# Patient Record
Sex: Male | Born: 1990 | Race: White | Hispanic: No | Marital: Married | Smoking: Never smoker
Health system: Southern US, Community
[De-identification: ages and names within clinical notes are randomized; demographics above are authoritative.]

## PROBLEM LIST (undated history)

## (undated) DIAGNOSIS — N200 Calculus of kidney: Secondary | ICD-10-CM

## (undated) HISTORY — PX: ADENOIDECTOMY: SUR15

## (undated) HISTORY — PX: TONSILLECTOMY: SUR1361

## (undated) HISTORY — PX: ABDOMINAL SURGERY: SHX537

## (undated) HISTORY — PX: OTHER SURGICAL HISTORY: SHX169

## (undated) HISTORY — DX: Calculus of kidney: N20.0

---
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
## 2013-02-08 ENCOUNTER — Encounter (HOSPITAL_COMMUNITY): Payer: Self-pay

## 2013-02-08 ENCOUNTER — Emergency Department (HOSPITAL_COMMUNITY)
Admission: EM | Admit: 2013-02-08 | Discharge: 2013-02-08 | Disposition: A | Discharge status: Home / Self Care | Payer: BC Managed Care – PPO | Attending: Emergency Medicine | Admitting: Emergency Medicine

## 2013-02-08 DIAGNOSIS — R109 Unspecified abdominal pain: Secondary | ICD-10-CM | POA: Insufficient documentation

## 2013-02-08 DIAGNOSIS — T161XXA Foreign body in right ear, initial encounter: Secondary | ICD-10-CM

## 2013-02-08 DIAGNOSIS — Y939 Activity, unspecified: Secondary | ICD-10-CM | POA: Insufficient documentation

## 2013-02-08 DIAGNOSIS — T169XXA Foreign body in ear, unspecified ear, initial encounter: Secondary | ICD-10-CM | POA: Insufficient documentation

## 2013-02-08 DIAGNOSIS — Y929 Unspecified place or not applicable: Secondary | ICD-10-CM | POA: Insufficient documentation

## 2013-02-08 DIAGNOSIS — T162XXA Foreign body in left ear, initial encounter: Secondary | ICD-10-CM

## 2013-02-08 DIAGNOSIS — IMO0002 Reserved for concepts with insufficient information to code with codable children: Secondary | ICD-10-CM | POA: Insufficient documentation

## 2013-02-08 MED ORDER — NEOMYCIN-POLYMYXIN-HC 3.5-10000-1 OT SOLN
3.0000 [drp] | Freq: Once | OTIC | Status: AC
  Administered 2013-02-08: 3 [drp] via OTIC
  Filled 2013-02-08: qty 10

## 2013-02-08 NOTE — ED Provider Notes (Signed)
Medical screening examination/treatment/procedure(s) were performed by non-physician practitioner and as supervising physician I was immediately available for consultation/collaboration.  Donnetta Hutching, MD 02/08/13 [phone]

## 2013-02-08 NOTE — ED Notes (Signed)
q-tip stuck in left ear for 1 day.

## 2013-02-08 NOTE — ED Provider Notes (Signed)
History     CSN: [id number]  Arrival date & time 02/08/13  1510   First MD Initiated Contact with Patient 02/08/13 1520      Chief Complaint  Patient presents with  . Foreign Body in Ear    (Consider location/radiation/quality/duration/timing/severity/associated sxs/prior treatment) Patient is a 22 y.o. male presenting with foreign body in ear. The history is provided by the patient.  Foreign Body in Ear This is a new problem. The current episode started yesterday. The problem occurs constantly. The problem has been unchanged. Associated symptoms include abdominal pain. Pertinent negatives include no arthralgias, chest pain, coughing, fever, neck pain, sore throat or swollen glands. Nothing aggravates the symptoms. He has tried nothing for the symptoms. The treatment provided no relief.    History reviewed. No pertinent past medical history.  Past Surgical History  Procedure Laterality Date  . Abdominal surgery    . Hernia repair    . Tonsillectomy    . Adenoidectomy      No family history on file.  History  Substance Use Topics  . Smoking status: Never Smoker   . Smokeless tobacco: Not on file  . Alcohol Use: No      Review of Systems  Constitutional: Negative for fever and activity change.       All ROS Neg except as noted in HPI  HENT: Negative for nosebleeds, sore throat and neck pain.   Eyes: Negative for photophobia and discharge.  Respiratory: Negative for cough, shortness of breath and wheezing.   Cardiovascular: Negative for chest pain and palpitations.  Gastrointestinal: Positive for abdominal pain. Negative for blood in stool.  Genitourinary: Negative for dysuria, frequency and hematuria.  Musculoskeletal: Negative for back pain and arthralgias.  Skin: Negative.   Neurological: Negative for dizziness, seizures and speech difficulty.  Psychiatric/Behavioral: Negative for hallucinations and confusion.    Allergies  Review of patient's allergies  indicates no known allergies.  Home Medications  No current outpatient prescriptions on file.  BP 139/69  Pulse 90  Temp(Src) 97.9 F (36.6 C) (Oral)  Resp 20  Ht 6' (1.829 m)  Wt 220 lb (99.791 kg)  BMI 29.83 kg/m2  SpO2 100%  Physical Exam  Nursing note and vitals reviewed. Constitutional: He is oriented to person, place, and time. He appears well-developed and well-nourished.  Non-toxic appearance.  HENT:  Head: Normocephalic.  Right Ear: Tympanic membrane and external ear normal.  Left Ear: Tympanic membrane and external ear normal.  Small fb in the right EAC. TM wnl.  Left external auditory canal is obstructed with a foreign body. There's no pain with manipulation of the ear. There's no redness or swelling behind the ear. There is no drainage from the ear.  Eyes: EOM and lids are normal. Pupils are equal, round, and reactive to light.  Neck: Normal range of motion. Neck supple. Carotid bruit is not present.  Cardiovascular: Normal rate, regular rhythm, normal heart sounds, intact distal pulses and normal pulses.   Pulmonary/Chest: Breath sounds normal. No respiratory distress.  Abdominal: Soft. Bowel sounds are normal. There is no tenderness. There is no guarding.  Musculoskeletal: Normal range of motion.  Lymphadenopathy:       Head (right side): No submandibular adenopathy present.       Head (left side): No submandibular adenopathy present.    He has no cervical adenopathy.  Neurological: He is alert and oriented to person, place, and time. He has normal strength. No cranial nerve deficit or sensory deficit.  Skin:  Skin is warm and dry.  Psychiatric: He has a normal mood and affect. His speech is normal.    ED Course  Procedures:  REMOVAL OF FB LEFT EAR.  Patient identified by arm band. Permission for the procedure is given by the patient. Procedural time out taken before removal of foreign body from the left year.  Procedure explained to the patient in terms which  he understands, and he is in agreement with the procedure.  Using an otoscope, the foreign body was located in the external auditory canal of the left ear. Using sterile alligator forceps, the foreign body was mobilized, and then removed. After the removal of foreign body the external auditory canal was evaluated. There is no bleeding present. The left tympanic membrane is still difficult to see due to to a small wax impaction just over the tympanic membrane. Patient states he can hear much better. Patient tolerated the procedure without problem.  Labs Reviewed - No data to display No results found.   No diagnosis found.    MDM  I have reviewed nursing notes, vital signs, and all appropriate lab and imaging results for this patient. The patient admits to using Q-tips on a regular basis. One to 2 days ago he got the entire head of a Q-tip stuck in the left year, and a portion of Q-tip stuck in the right ear (date unknown). The right ear was irrigated, the foreign body of the left year was removed without any forceps without complication.  The patient has been admonished not to use Q-tips. Patient is given Cortisporin otic suspension for both years in the evening any injury to the auditory canal that was not visualized on this examination. Patient also advised to use a ear wax removal kit from his local drugstore if having problems moving wax with regular soap and water and washcloth.       Kathie Dike, PA-C 02/08/13 [phone]

## 2013-02-08 NOTE — ED Notes (Signed)
Patient has a small amount of cotton and wax noted in right ear as well. Patient's ear irrigated with good results.

## 2014-05-26 ENCOUNTER — Emergency Department (HOSPITAL_COMMUNITY): Payer: PRIVATE HEALTH INSURANCE

## 2014-05-26 ENCOUNTER — Encounter (HOSPITAL_COMMUNITY): Payer: Self-pay | Admitting: Emergency Medicine

## 2014-05-26 ENCOUNTER — Emergency Department (HOSPITAL_COMMUNITY)
Admission: EM | Admit: 2014-05-26 | Discharge: 2014-05-27 | Disposition: A | Discharge status: Home / Self Care | Payer: PRIVATE HEALTH INSURANCE | Attending: Emergency Medicine | Admitting: Emergency Medicine

## 2014-05-26 DIAGNOSIS — N200 Calculus of kidney: Secondary | ICD-10-CM | POA: Insufficient documentation

## 2014-05-26 LAB — URINALYSIS, ROUTINE W REFLEX MICROSCOPIC
Glucose, UA: 250 mg/dL — AB
NITRITE: POSITIVE — AB
PH: 5.5 (ref 5.0–8.0)
PROTEIN: 100 mg/dL — AB
Specific Gravity, Urine: 1.02 (ref 1.005–1.030)
Urobilinogen, UA: >8 mg/dL — ABNORMAL HIGH (ref 0.0–1.0)

## 2014-05-26 LAB — URINE MICROSCOPIC-ADD ON

## 2014-05-26 MED ORDER — ONDANSETRON HCL 4 MG/2ML IJ SOLN: 4.0000 mg | Freq: Once | INTRAMUSCULAR | Status: DC

## 2014-05-26 MED ORDER — HYDROMORPHONE HCL PF 1 MG/ML IJ SOLN
1.0000 mg | Freq: Once | INTRAMUSCULAR | Status: AC
  Administered 2014-05-26: 1 mg via INTRAVENOUS
  Filled 2014-05-26: qty 1

## 2014-05-26 MED ORDER — ONDANSETRON HCL 4 MG/2ML IJ SOLN
4.0000 mg | Freq: Once | INTRAMUSCULAR | Status: AC
  Administered 2014-05-26: 4 mg via INTRAVENOUS
  Filled 2014-05-26: qty 2

## 2014-05-26 NOTE — ED Notes (Signed)
Right flank pain x 1 week, with painful burning urination.  progressively gotten worse. Taken tylenol, ibuprofen and alieve with no relief.

## 2014-05-26 NOTE — ED Provider Notes (Signed)
CSN: [id number]     Arrival date & time 05/26/14  2202 History   First MD Initiated Contact with Patient 05/26/14 2302     Chief Complaint  Patient presents with  . Flank Pain     (Consider location/radiation/quality/duration/timing/severity/associated sxs/prior Treatment) Patient is a 23 y.o. male presenting with flank pain. The history is provided by the patient.  Flank Pain This is a new problem. The current episode started today. The problem occurs constantly. The problem has been unchanged. Associated symptoms include urinary symptoms. Pertinent negatives include no abdominal pain, diaphoresis, fever, joint swelling, nausea, numbness, vomiting or weakness. Exacerbated by: urination. He has tried drinking for the symptoms. The treatment provided no relief.    Patient reports brief episode of right flank pain that began 6 days ago that spontaneously resolved.  Tonight, he states the pain returned and has been severe for 1-2 hours.  He also reports burning with urination and hesitancy.  Pain to the right flank that radiates to the right lower abdomen.  He has tried drinking water and cranberry juice w/o relief.  He denies vomiting, fever, chills, penile d/c or testicle pain.  Patient denies hx of kidney stones, but states his mother has kidney stones .  Pt has been taking AZO standard w/o relief.    History reviewed. No pertinent past medical history. Past Surgical History  Procedure Laterality Date  . Abdominal surgery    . Hernia repair    . Tonsillectomy    . Adenoidectomy     History reviewed. No pertinent family history. History  Substance Use Topics  . Smoking status: Never Smoker   . Smokeless tobacco: Not on file  . Alcohol Use: No    Review of Systems  Constitutional: Negative for fever, diaphoresis, activity change and appetite change.  Gastrointestinal: Negative for nausea, vomiting, abdominal pain and abdominal distention.  Genitourinary: Positive for dysuria, flank  pain and difficulty urinating. Negative for urgency, hematuria, discharge, penile swelling, scrotal swelling and testicular pain.  Musculoskeletal: Negative for joint swelling.  Neurological: Negative for weakness and numbness.  All other systems reviewed and are negative.     Allergies  Review of patient's allergies indicates no known allergies.  Home Medications   Prior to Admission medications   Not on File   BP 146/83  Pulse 82  Temp(Src) 98.4 F (36.9 C) (Oral)  Resp 18  Ht 6\' 3"  (1.905 m)  Wt 220 lb (99.791 kg)  BMI 27.50 kg/m2  SpO2 99% Physical Exam  Nursing note and vitals reviewed. Constitutional: He is oriented to person, place, and time. He appears well-developed and well-nourished.  Patient appears uncomfortable, pacing in the exam room  HENT:  Head: Normocephalic and atraumatic.  Neck: Normal range of motion. Neck supple.  Cardiovascular: Normal rate, regular rhythm, normal heart sounds and intact distal pulses.   No murmur heard. Pulmonary/Chest: Effort normal and breath sounds normal. No respiratory distress.  Abdominal: Soft. Normal appearance. He exhibits no distension. There is no tenderness. There is no rebound, no guarding and no CVA tenderness.  Pt describes pain to right flank that radiates to the RLQ.  No CVA tenderness on exam.  Abdomen is soft, NT on exam.    Musculoskeletal: Normal range of motion.  Neurological: He is alert and oriented to person, place, and time. He exhibits normal muscle tone. Coordination normal.  Skin: Skin is warm and dry. No rash noted.    ED Course  Procedures (including critical care time) Labs Review  Labs Reviewed  URINALYSIS, ROUTINE W REFLEX MICROSCOPIC - Abnormal; Notable for the following:    Color, Urine ORANGE (*)    Glucose, UA 250 (*)    Hgb urine dipstick LARGE (*)    Bilirubin Urine SMALL (*)    Ketones, ur TRACE (*)    Protein, ur 100 (*)    Urobilinogen, UA >8.0 (*)    Nitrite POSITIVE (*)     Leukocytes, UA TRACE (*)    All other components within normal limits  URINE MICROSCOPIC-ADD ON - Abnormal; Notable for the following:    Bacteria, UA FEW (*)    Crystals CA OXALATE CRYSTALS (*)    All other components within normal limits  URINE CULTURE    Imaging Review Ct Abdomen Pelvis Wo Contrast  05/27/2014   CLINICAL DATA:  Flank pain and dysuria  EXAM: CT ABDOMEN AND PELVIS WITHOUT CONTRAST  TECHNIQUE: Multidetector CT imaging of the abdomen and pelvis was performed following the standard protocol without oral or intravenous contrast material administration.  COMPARISON:  January 17, 2008  FINDINGS: Lung bases are clear.  No focal liver lesions are identified on this noncontrast enhanced study. There is no biliary duct dilatation. Gallbladder wall is not appreciably thickened.  Spleen, pancreas, adrenals appear normal.  There is a 1.2 x 1.0 cm cyst in the mid left kidney laterally. No other renal masses are identified. There is no hydronephrosis on the left. 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 There is no intrarenal calculus on either side.  There is a 3 mm calculus at the right ureterovesical junction. No other ureteral calculus identified on either side.  In the pelvis, the urinary bladder is midline without wall thickness. There is a prostatic calculus. There is no pelvic mass or fluid collection. The appendix appears normal.  There is no bowel obstruction. There is no free air or portal venous air. There is a small ventral hernia which contains only fat.  There is no ascites, adenopathy, or abscess in the abdomen or pelvis. The aorta is nonaneurysmal. There are no blastic or lytic bone lesions.  IMPRESSION: 3 mm calculus right ureterovesical junction with moderate hydronephrosis on the right. 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  Both moieties show moderate hydronephrosis.  No bowel obstruction. No abscess. Appendix appears normal. Note that there is a prostatic calculus present. There is a small ventral hernia containing only fat.   Electronically Signed   By: Bretta Bang M.D.   On: 05/27/2014 00:19     EKG Interpretation None      MDM   Final diagnoses:  Right kidney stone    Pt with sudden onset of right flank pain tonight that radiates to the lower abdomen associated with dysuria and hesitancy.  Sx's likely related to kidney stone.  No CVA tenderness, fever, or chills to suggest pyelonephritis.  Pt is circumcised , U/A results likely related to AZO  Pt is feeling better after medication and is now resting comfortably.  Plan includes urine culture, percocet, flomax , urine strainer and he agrees to close urology f/u this week.  Advised to return here if the sx's are not improving.  He appears stable for d/c   Jamez Ambrocio L. Trisha Mangle, PA-C 05/27/14 [phone]

## 2014-05-27 MED ORDER — KETOROLAC TROMETHAMINE 30 MG/ML IJ SOLN
30.0000 mg | Freq: Once | INTRAMUSCULAR | Status: AC
  Administered 2014-05-27: 30 mg via INTRAVENOUS
  Filled 2014-05-27: qty 1

## 2014-05-27 MED ORDER — OXYCODONE-ACETAMINOPHEN 5-325 MG PO TABS: 1.0000 | ORAL_TABLET | ORAL | Status: DC | PRN

## 2014-05-27 MED ORDER — TAMSULOSIN HCL 0.4 MG PO CAPS: 0.4000 mg | ORAL_CAPSULE | Freq: Every day | ORAL | Status: DC

## 2014-05-27 NOTE — ED Provider Notes (Signed)
Medical screening examination/treatment/procedure(s) were performed by non-physician practitioner and as supervising physician I was immediately available for consultation/collaboration.    Vida RollerBrian D Ailish Prospero, MD 05/27/14 [phone]

## 2014-05-27 NOTE — Discharge Instructions (Signed)

## 2014-05-27 NOTE — ED Notes (Signed)
Patient verbalizes understanding of discharge instructions, prescription medications, follow up care and home care. Patient ambulatory out of department at this time. 

## 2014-05-28 LAB — URINE CULTURE
Colony Count: NO GROWTH
Culture: NO GROWTH

## 2014-06-03 MED FILL — Oxycodone w/ Acetaminophen Tab 5-325 MG: ORAL | Qty: 6 | Status: AC

## 2015-03-30 IMAGING — CT CT ABD-PELV W/O CM
3 of 4 series · 7 of 46 positions shown, 13 images · non-contrast
Comparison: January 17, 2008

CLINICAL DATA: Flank pain and dysuria

EXAM:
CT ABDOMEN AND PELVIS WITHOUT CONTRAST
TECHNIQUE: Multidetector CT imaging of the abdomen and pelvis was performed
following the standard protocol without oral or intravenous contrast
material administration.

[Series 3: mpr coronal (id) · coronal · 0.84mm/px · 3 of 101 slices shown, 4 images]
[im 34/101  soft-tissue]
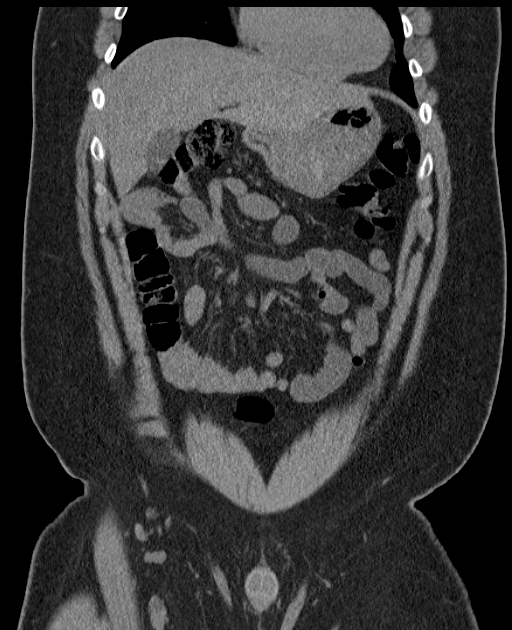
[im 45/101  soft-tissue]
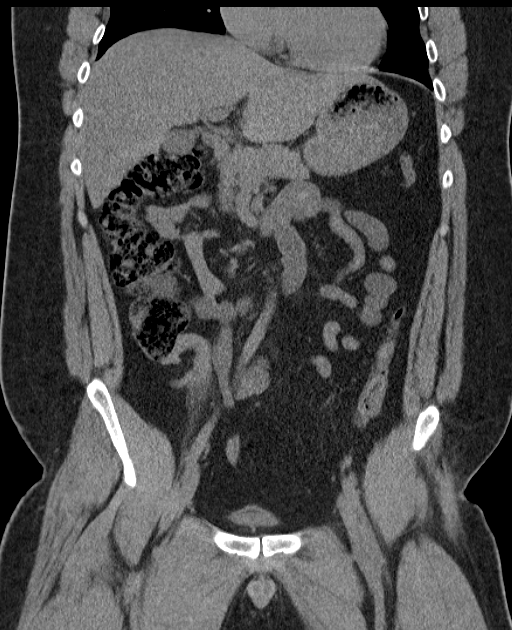
[im 45/101  bone]
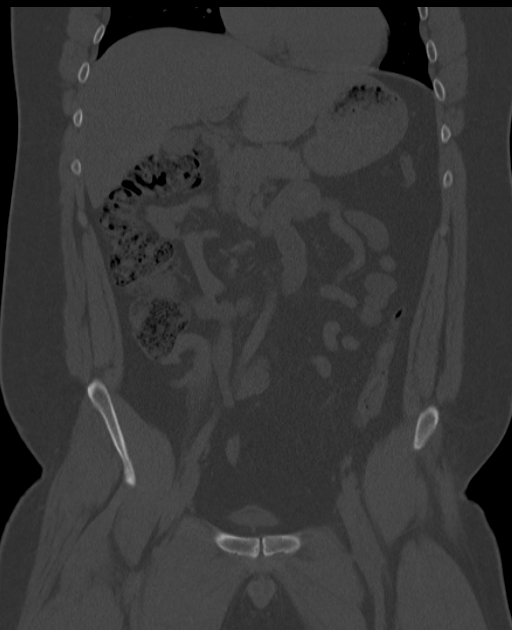
[im 56/101  soft-tissue]
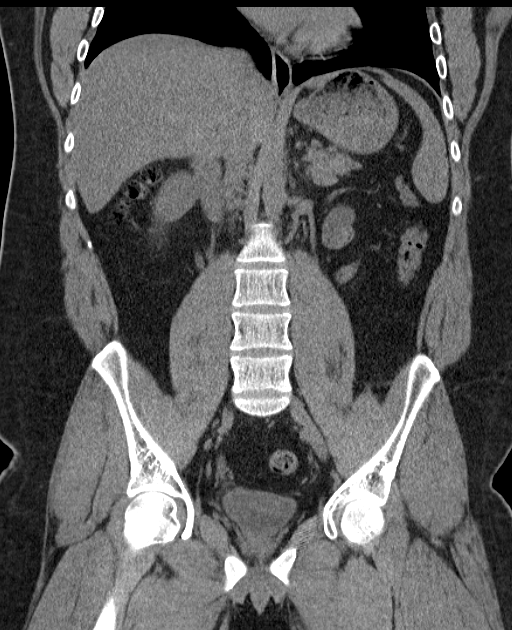

[Series 4: mpr sagittal (id) · sagittal · 0.70mm/px · 1 of 143 slices shown, 2 images]
[im 48/143  soft-tissue]
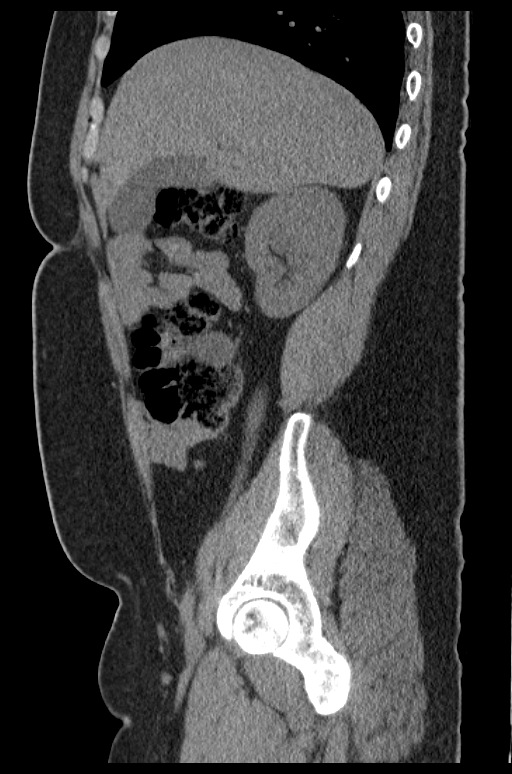
[im 48/143  bone]
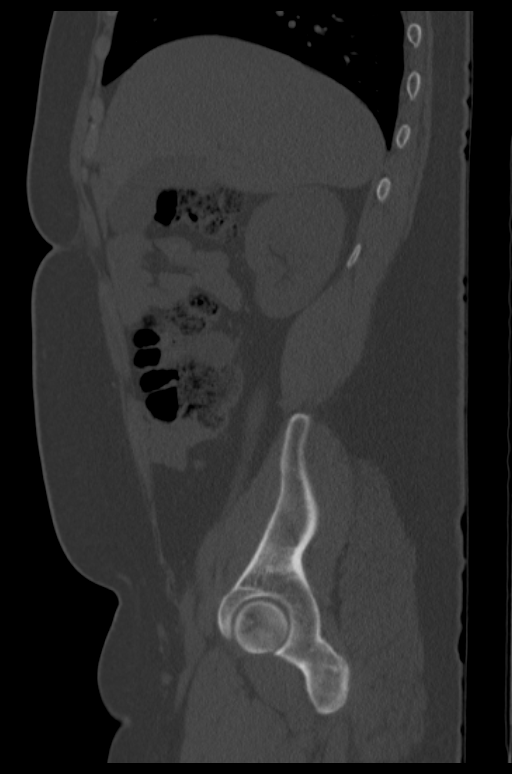

[Series 6: lung 5.0 b60f · axial · 0.87mm/px · z∈[-124,-74]mm · 3 of 21 slices shown, 7 images]
[im 6/21  soft-tissue]
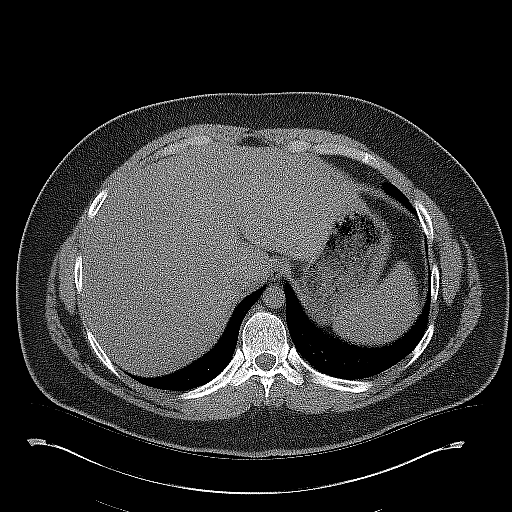
[im 6/21  lung]
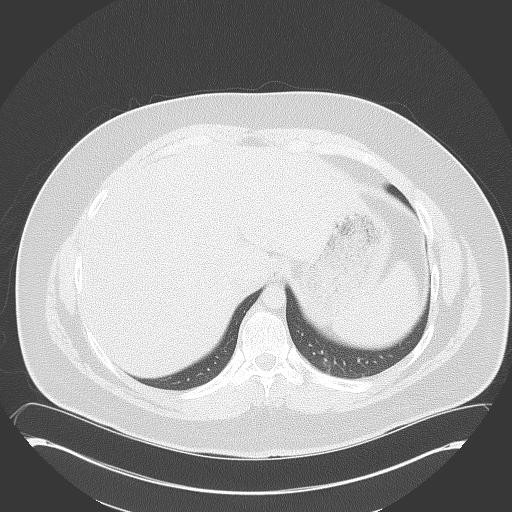
[im 6/21  bone]
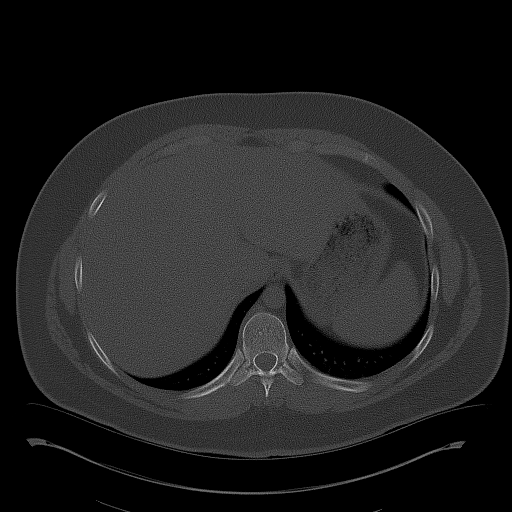
[im 11/21  soft-tissue]
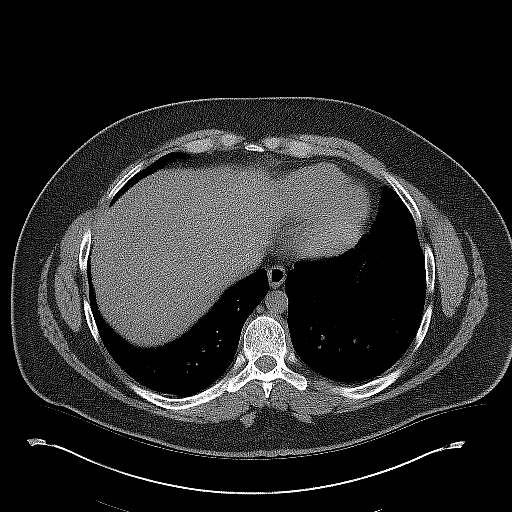
[im 11/21  lung]
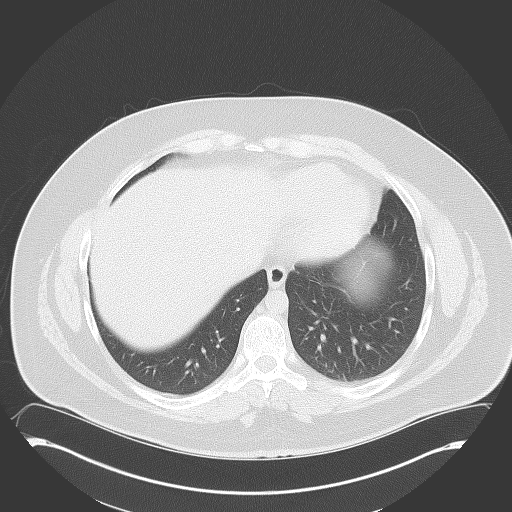
[im 16/21  soft-tissue]
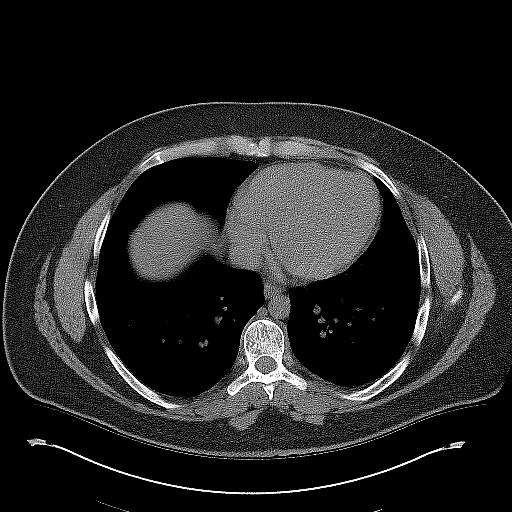
[im 16/21  lung]
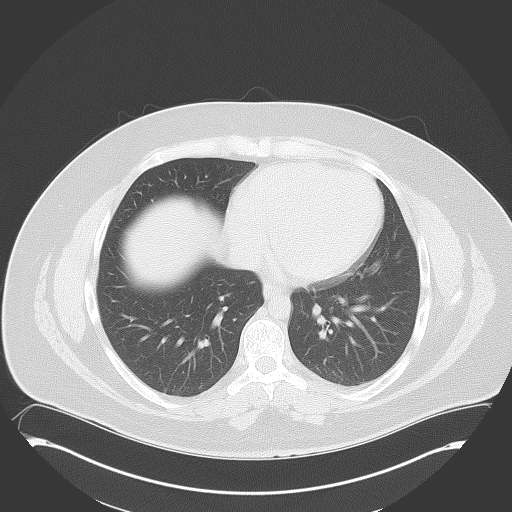

[7 of 46 positions shown; findings below may reference images not displayed]

FINDINGS: Lung bases are clear.

No focal liver lesions are identified on this noncontrast enhanced
study. There is no biliary duct dilatation. Gallbladder wall is not
appreciably thickened.

Spleen, pancreas, adrenals appear normal.

There is a 1.2 x 1.0 cm cyst in the mid left kidney laterally. No
other renal masses are identified. There is no hydronephrosis on the
left. There is a duplicated collecting system on the right with
moderate hydronephrosis of the upper and lower pole moieties. There
is no intrarenal calculus on either side.

There is a 3 mm calculus at the right ureterovesical junction. No
other ureteral calculus identified on either side.

In the pelvis, the urinary bladder is midline without wall
thickness. There is a prostatic calculus. There is no pelvic mass or
fluid collection. The appendix appears normal.

There is no bowel obstruction. There is no free air or portal venous
air. There is a small ventral hernia which contains only fat.

There is no ascites, adenopathy, or abscess in the abdomen or
pelvis. The aorta is nonaneurysmal. There are no blastic or lytic
bone lesions.
IMPRESSION: 3 mm calculus right ureterovesical junction with moderate
hydronephrosis on the right. There is duplication the right renal
collecting system with merging of the upper pole and lower pole
moieties proximally. Both moieties show moderate hydronephrosis.

No bowel obstruction. No abscess. Appendix appears normal. Note that
there is a prostatic calculus present. There is a small ventral
hernia containing only fat.

## 2016-08-12 ENCOUNTER — Ambulatory Visit (INDEPENDENT_AMBULATORY_CARE_PROVIDER_SITE_OTHER): Payer: Managed Care, Other (non HMO) | Admitting: Physician Assistant

## 2016-08-12 VITALS — BP 128/72 | HR 83 | Temp 98.3°F | Resp 16 | Ht 71.5 in | Wt 249.6 lb

## 2016-08-12 DIAGNOSIS — R05 Cough: Secondary | ICD-10-CM | POA: Diagnosis not present

## 2016-08-12 DIAGNOSIS — J069 Acute upper respiratory infection, unspecified: Secondary | ICD-10-CM | POA: Diagnosis not present

## 2016-08-12 DIAGNOSIS — R059 Cough, unspecified: Secondary | ICD-10-CM

## 2016-08-12 MED ORDER — AZITHROMYCIN 250 MG PO TABS
ORAL_TABLET | ORAL | 0 refills | Status: DC
Start: 2016-08-12 — End: 2017-08-22

## 2016-08-12 NOTE — Progress Notes (Signed)
   08/12/2016 5:56 PM   DOB: [birth year] / MRN: [id number]  SUBJECTIVE:  [name] is a 25 y.o. male presenting for cough, nasal congestion and sore throat that started seven days ago.  Says he is not feeling much better today than when the illness started.  He is a never smoker and denies a history of asthma.  He has tried Nyquil and Dayquil for his symptoms and says this has not helped him. He denies fever at this time.   He has No Known Allergies.   He  has a past medical history of Kidney stones.    He  reports that he has never smoked. He has never used smokeless tobacco. He reports that he does not drink alcohol or use drugs. He  reports that he does not engage in sexual activity. The patient  has a past surgical history that includes Abdominal surgery; Tonsillectomy; Adenoidectomy; and pyloric stenosis.  His family history includes Birth defects in his maternal grandmother; Diabetes in his maternal grandfather, maternal grandmother, and mother; Heart disease in his maternal grandmother; Hyperlipidemia in his maternal grandmother; Stroke in his maternal grandmother.  Review of Systems  Constitutional: Negative for chills and fever.  HENT: Positive for congestion and sore throat.   Respiratory: Positive for cough, sputum production and shortness of breath. Negative for hemoptysis and wheezing.   Cardiovascular: Positive for chest pain (with cough only).  Skin: Negative for rash.  Neurological: Positive for headaches. Negative for dizziness.    The problem list and medications were reviewed and updated by myself where necessary and exist elsewhere in the encounter.   OBJECTIVE:  BP 128/72 (BP Location: Right Arm, Cuff Size: Large)   Pulse 83   Temp 98.3 F (36.8 C) (Oral)   Resp 16   Ht 5' 11.5" (1.816 m)   Wt 249 lb 9.6 oz (113.2 kg)   SpO2 99%   BMI 34.33 kg/m   Physical Exam  Constitutional: He is oriented to person, place, and time. He appears well-developed and  well-nourished.  Cardiovascular: Normal rate, regular rhythm and normal heart sounds.   No murmur heard. Pulmonary/Chest: Effort normal and breath sounds normal. No respiratory distress. He has no wheezes. He has no rales. He exhibits no tenderness.  Musculoskeletal: Normal range of motion.  Neurological: He is oriented to person, place, and time.  Skin: Skin is warm and dry.  Vitals reviewed.   No results found for this or any previous visit (from the past 72 hour(s)).  No results found.  ASSESSMENT AND PLAN  [name] was seen today for nasal congestion.  Diagnoses and all orders for this visit:  Acute URI: His exam is unremarkable and his symptoms are consistent with a viral URI. See AVS.  Advised that if his illness goes to day 10 without improvement then okay to fill abx.  Okay to fill if febrile.  Otherwise Zyrtec D and Ibuprofen. -     azithromycin (ZITHROMAX) 250 MG tablet; Take two tabs on day one and 1 everyday thereafter.  Cough: See the first problem.     The patient is advised to call or return to clinic if he does not see an improvement in symptoms, or to seek the care of the closest emergency department if he worsens with the above plan.   Deliah BostonMichael Clark, MHS, PA-C [facility] 08/12/2016 5:56 PM

## 2016-08-12 NOTE — Patient Instructions (Signed)
Try taking Zyrtec-D (5/120) in the morning and night along with 600 mg of Ibuprofen every 8 hours for pain and myalgia.  If your symptoms fail to remiss by day 10-12 of total illness, or if you suddenly develop fever, it is okay to fill the antibiotic.

## 2017-08-22 ENCOUNTER — Emergency Department (HOSPITAL_COMMUNITY)
Admission: EM | Admit: 2017-08-22 | Discharge: 2017-08-23 | Disposition: A | Discharge status: Home / Self Care | Payer: Medicaid Other | Attending: Emergency Medicine | Admitting: Emergency Medicine

## 2017-08-22 ENCOUNTER — Encounter (HOSPITAL_COMMUNITY): Payer: Self-pay | Admitting: Emergency Medicine

## 2017-08-22 ENCOUNTER — Emergency Department (HOSPITAL_COMMUNITY): Payer: Medicaid Other

## 2017-08-22 DIAGNOSIS — R112 Nausea with vomiting, unspecified: Secondary | ICD-10-CM | POA: Insufficient documentation

## 2017-08-22 DIAGNOSIS — R109 Unspecified abdominal pain: Secondary | ICD-10-CM

## 2017-08-22 DIAGNOSIS — R1032 Left lower quadrant pain: Secondary | ICD-10-CM | POA: Diagnosis present

## 2017-08-22 DIAGNOSIS — N200 Calculus of kidney: Secondary | ICD-10-CM | POA: Insufficient documentation

## 2017-08-22 MED ORDER — ONDANSETRON HCL 4 MG/2ML IJ SOLN
4.0000 mg | Freq: Once | INTRAMUSCULAR | Status: AC
  Administered 2017-08-22: 4 mg via INTRAVENOUS
  Filled 2017-08-22: qty 2

## 2017-08-22 MED ORDER — HYDROMORPHONE HCL 1 MG/ML IJ SOLN
1.0000 mg | Freq: Once | INTRAMUSCULAR | Status: AC
  Administered 2017-08-22: 1 mg via INTRAVENOUS
  Filled 2017-08-22: qty 1

## 2017-08-22 MED ORDER — KETOROLAC TROMETHAMINE 30 MG/ML IJ SOLN
30.0000 mg | Freq: Once | INTRAMUSCULAR | Status: AC
  Administered 2017-08-22: 30 mg via INTRAVENOUS
  Filled 2017-08-22: qty 1

## 2017-08-22 NOTE — ED Triage Notes (Signed)
Pt c/o left flank/lower back pain since Thursday.

## 2017-08-22 NOTE — ED Provider Notes (Signed)
[facility] Provider Note   CSN: [id number] Arrival date & time: 08/22/17  2200     History   Chief Complaint Chief Complaint  Patient presents with  . Flank Pain    HPI [name] is a 26 y.o. male.  HPI   [name] is a 26 y.o. male who presents to the Emergency Department complaining of intermittent left flank pain for one week.  Pain has been sharp at times and radiates to the left groin.  Pain became persistent today and now associated with nausea and vomiting.  He also describes having very dark colored urine for several days.  Has hx of kidney stones, pain this evening feels similar to previous.  He denies fever, penile discharge, testicular pain or swelling.  Nothing makes the pain better or worse.   Past Medical History:  Diagnosis Date  . Kidney stones     Patient Active Problem List   Diagnosis Date Noted  . OBESITY 09/12/2008  . INGUINAL HERNIA 09/12/2008  . PYLORIC STENOSIS 09/12/2008    Past Surgical History:  Procedure Laterality Date  . ABDOMINAL SURGERY    . ADENOIDECTOMY    . pyloric stenosis    . TONSILLECTOMY         Home Medications    Prior to Admission medications   Not on File    Family History Family History  Problem Relation Age of Onset  . Diabetes Mother   . Birth defects Maternal Grandmother   . Diabetes Maternal Grandmother   . Hyperlipidemia Maternal Grandmother   . Heart disease Maternal Grandmother   . Stroke Maternal Grandmother   . Diabetes Maternal Grandfather     Social History Social History  Substance Use Topics  . Smoking status: Never Smoker  . Smokeless tobacco: Never Used  . Alcohol use No     Allergies   Codeine   Review of Systems Review of Systems  Constitutional: Negative for activity change, appetite change, chills and fever.  Respiratory: Negative for chest tightness and shortness of breath.   Gastrointestinal: Positive for nausea and vomiting. Negative for  abdominal pain.  Genitourinary: Positive for hematuria. Negative for decreased urine volume, difficulty urinating, discharge, dysuria, flank pain (left flank pain), frequency, penile swelling, scrotal swelling, testicular pain and urgency.  Musculoskeletal: Negative for back pain.  Skin: Negative for rash.  Neurological: Negative for dizziness, weakness and numbness.  Hematological: Negative for adenopathy.  Psychiatric/Behavioral: Negative for confusion.  All other systems reviewed and are negative.    Physical Exam Updated Vital Signs BP (!) 155/98   Pulse 87   Temp (!) 97.2 F (36.2 C)   Resp 20   Ht  (1.803 m)   Wt 113.4 kg (250 lb)   SpO2 99%   BMI 34.87 kg/m   Physical Exam  Constitutional: He is oriented to person, place, and time. He appears well-developed and well-nourished. No distress.  Uncomfortable appearing   HENT:  Head: Atraumatic.  Mouth/Throat: Oropharynx is clear and moist.  Cardiovascular: Normal rate, regular rhythm and intact distal pulses.   No murmur heard. Pulmonary/Chest: Effort normal and breath sounds normal. No respiratory distress.  Abdominal: Soft. He exhibits no distension. There is no tenderness. There is no guarding.  Pt points to left flank as area of pain.  No CVA tenderness on exam.  Abd also soft non-tender.    Musculoskeletal: Normal range of motion.  Neurological: He is alert and oriented to person, place, and time. No  sensory deficit.  Skin: Skin is warm. No rash noted.  Psychiatric: He has a normal mood and affect.  Nursing note and vitals reviewed.    ED Treatments / Results  Labs (all labs ordered are listed, but only abnormal results are displayed) Labs Reviewed  URINALYSIS, ROUTINE W REFLEX MICROSCOPIC - Abnormal; Notable for the following:       Result Value   APPearance CLOUDY (*)    Hgb urine dipstick LARGE (*)    Protein, ur 100 (*)    Bacteria, UA FEW (*)    Squamous Epithelial / LPF 0-5 (*)    All other  components within normal limits  URINE CULTURE    EKG  EKG Interpretation None       Radiology Ct Renal Stone Study  Result Date: 08/23/2017 CLINICAL DATA:  LEFT flank and lower back pain since Thursday, history kidney stones EXAM: CT ABDOMEN AND PELVIS WITHOUT CONTRAST TECHNIQUE: Multidetector CT imaging of the abdomen and pelvis was performed following the standard protocol without IV contrast. Sagittal and coronal MPR images reconstructed from axial data set. Oral contrast was not administered. COMPARISON:  05/26/2014 FINDINGS: Lower chest: Lung bases clear Hepatobiliary: Fatty infiltration of liver with focal sparing adjacent to gallbladder fossa. Liver and gallbladder otherwise unremarkable. Pancreas: Normal appearance Spleen: Normal appearance Adrenals/Urinary Tract: Adrenal glands normal appearance. Low-attenuation focus laterally in the LEFT kidney question cyst 15 mm diameter image 33. Mild LEFT hydronephrosis and proximal hydroureter with a tiny proximal LEFT ureteral calculus image 53 and an additional 3 mm diameter proximal LEFT ureteral calculus slightly more distally. No additional urinary tract calcifications. RIGHT ureter and distal LEFT ureter decompressed. Bladder unremarkable. Stomach/Bowel: Normal appendix. Stomach and bowel loops unremarkable. Vascular/Lymphatic: Normal appearance Reproductive: Prostate gland normal size with small central calcification. Seminal vesicles unremarkable. Other: No free air free fluid.  No hernia.  No inflammatory process. Musculoskeletal: Osseous structures unremarkable. IMPRESSION: Two small proximal LEFT ureteral calculi largest 3 mm diameter with mild LEFT hydronephrosis and proximal hydroureter. Probable small LEFT renal cyst. Fatty infiltration of liver. Electronically Signed   By: Ulyses Southward M.D.   On: 08/23/2017 00:51    Procedures Procedures (including critical care time)  Medications Ordered in ED Medications  HYDROmorphone  (DILAUDID) injection 1 mg (not administered)  ondansetron (ZOFRAN) injection 4 mg (not administered)  ketorolac (TORADOL) 30 MG/ML injection 30 mg (not administered)     Initial Impression / Assessment and Plan / ED Course  I have reviewed the triage vital signs and the nursing notes.  Pertinent labs & imaging results that were available during my care of the patient were reviewed by me and considered in my medical decision making (see chart for details).     0030  On recheck pt is feeling better, pain improved after IV medications.  Tolerating fluids.  Waiting on CT results.    Results discussed.  Pt stable for d/c.  Agrees to urology f/u.    Final Clinical Impressions(s) / ED Diagnoses   Final diagnoses:  Left flank pain  Kidney stone    New Prescriptions New Prescriptions   No medications on file     Pauline Aus, Cordelia Poche 08/23/17 0109    Zadie Rhine, MD 08/23/17 [phone]

## 2017-08-23 LAB — URINALYSIS, ROUTINE W REFLEX MICROSCOPIC
Bilirubin Urine: NEGATIVE
Glucose, UA: NEGATIVE mg/dL
Ketones, ur: NEGATIVE mg/dL
Leukocytes, UA: NEGATIVE
Nitrite: NEGATIVE
PROTEIN: 100 mg/dL — AB
SPECIFIC GRAVITY, URINE: 1.024 (ref 1.005–1.030)
pH: 5 (ref 5.0–8.0)

## 2017-08-23 MED ORDER — CEPHALEXIN 500 MG PO CAPS
500.0000 mg | ORAL_CAPSULE | Freq: Once | ORAL | Status: AC
  Administered 2017-08-23: 500 mg via ORAL
  Filled 2017-08-23: qty 1

## 2017-08-23 MED ORDER — CEPHALEXIN 500 MG PO CAPS: 500.0000 mg | ORAL_CAPSULE | Freq: Four times a day (QID) | ORAL | 0 refills | Status: AC

## 2017-08-23 MED ORDER — OXYCODONE-ACETAMINOPHEN 5-325 MG PO TABS: 1.0000 | ORAL_TABLET | ORAL | 0 refills | Status: AC | PRN

## 2017-08-23 MED ORDER — ONDANSETRON HCL 4 MG PO TABS: 4.0000 mg | ORAL_TABLET | Freq: Four times a day (QID) | ORAL | 0 refills | Status: AC

## 2017-08-23 NOTE — ED Notes (Signed)
Pt ambulatory to waiting room. Pt verbalized understanding of discharge instructions.   

## 2017-08-23 NOTE — Discharge Instructions (Signed)
Strain all urine.  Call the urologist listed to arrange a follow-up appt.  If needed.

## 2017-08-24 LAB — URINE CULTURE: Culture: NO GROWTH

## 2017-09-06 MED FILL — Oxycodone w/ Acetaminophen Tab 5-325 MG: ORAL | Qty: 6 | Status: AC

## 2018-06-27 IMAGING — CT CT RENAL STONE PROTOCOL
2 of 4 series · 16 of 46 positions shown, 18 images · non-contrast
Comparison: 05/26/2014

CLINICAL DATA: LEFT flank and lower back pain since [REDACTED],
history kidney stones

EXAM:
CT ABDOMEN AND PELVIS WITHOUT CONTRAST
TECHNIQUE: Multidetector CT imaging of the abdomen and pelvis was performed
following the standard protocol without IV contrast. Sagittal and
coronal MPR images reconstructed from axial data set. Oral contrast
was not administered.

[Series 2: axial st · axial · 0.98mm/px · z∈[+846,+1321]mm · 13 of 105 slices shown, 15 images]
[im 5/105  soft-tissue]
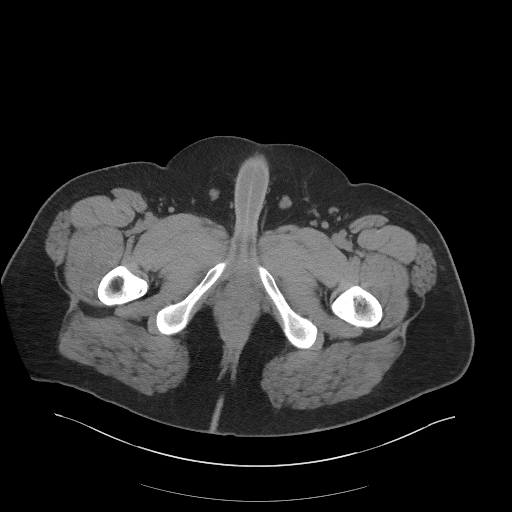
[im 5/105  bone]
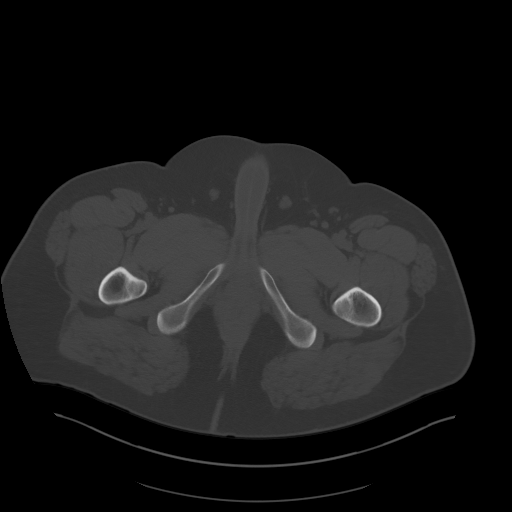
[im 14/105  soft-tissue]
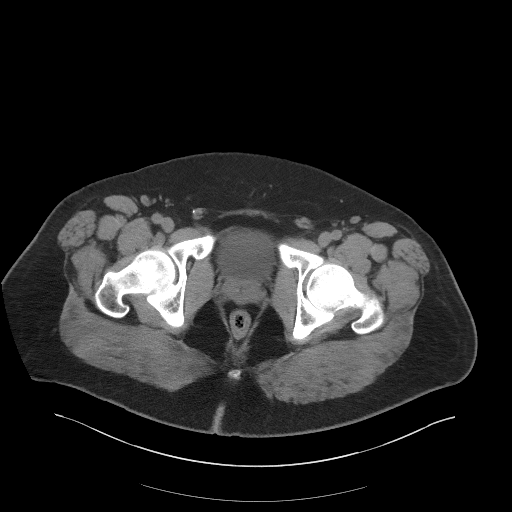
[im 23/105  soft-tissue]
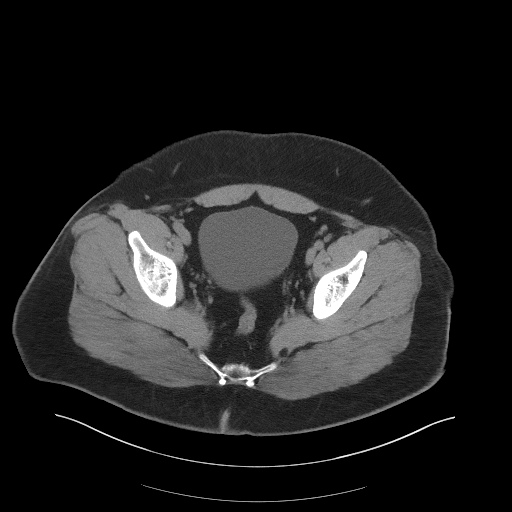
[im 28/105  soft-tissue]
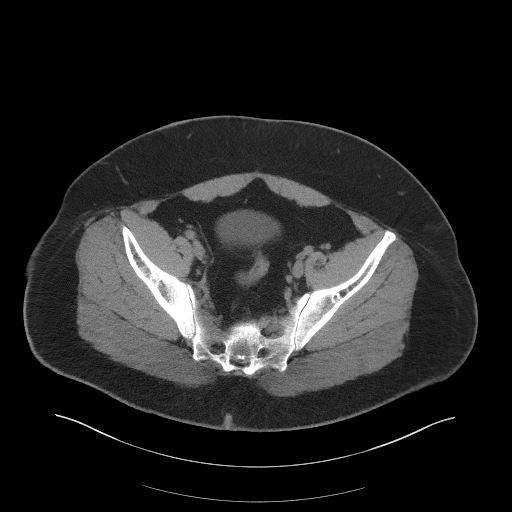
[im 37/105  soft-tissue]
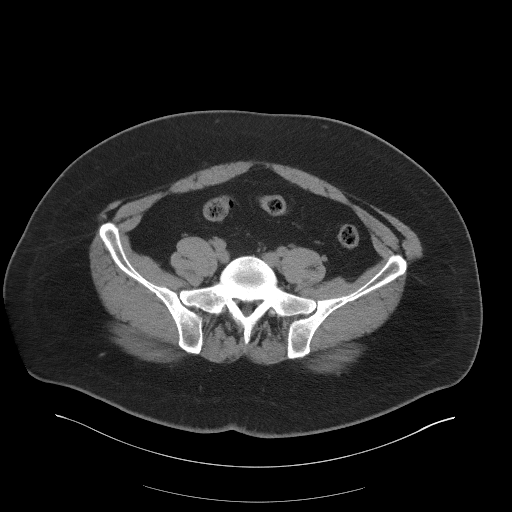
[im 46/105  soft-tissue]
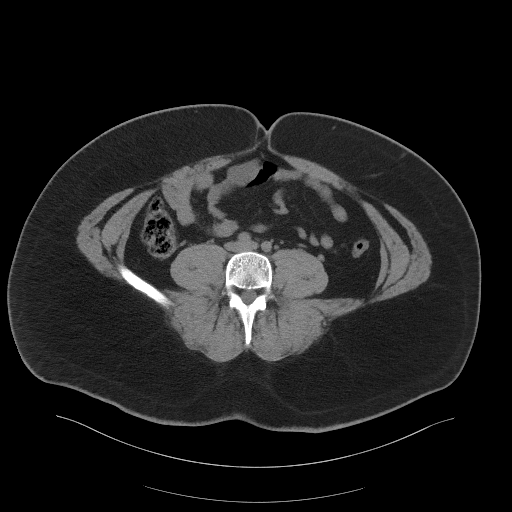
[im 55/105  soft-tissue]
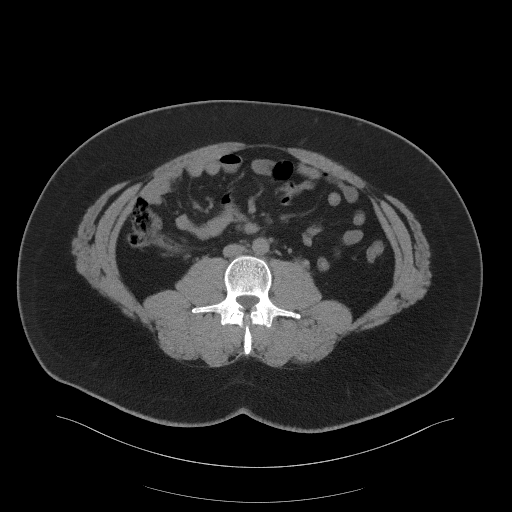
[im 59/105  soft-tissue]
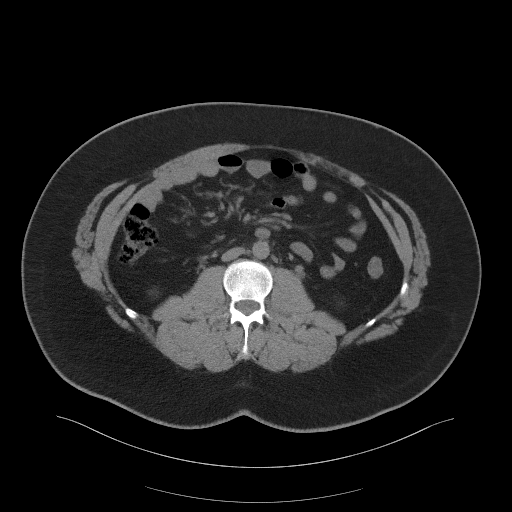
[im 68/105  soft-tissue]
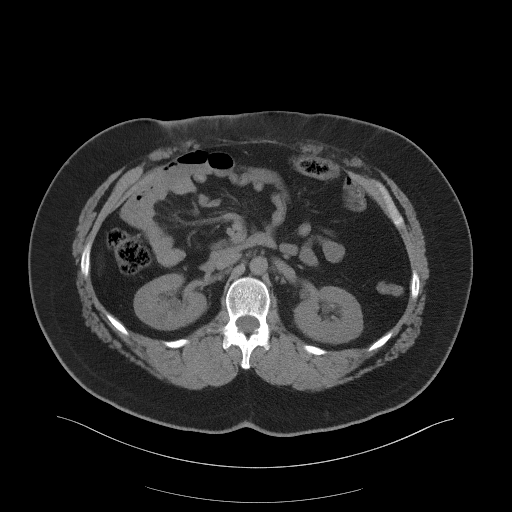
[im 68/105  bone]
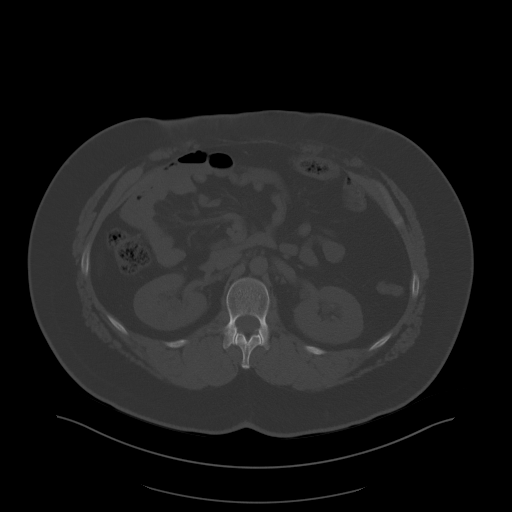
[im 77/105  soft-tissue]
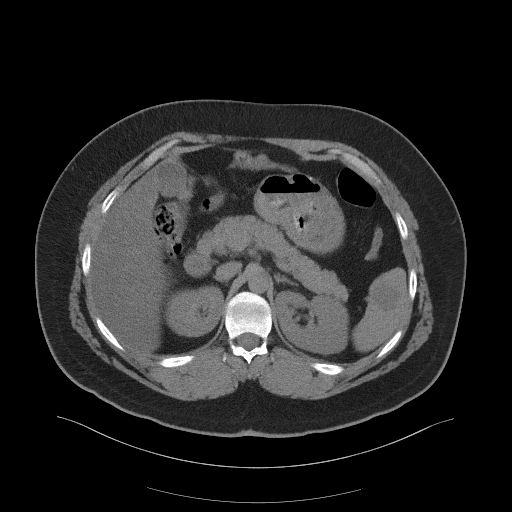
[im 82/105  soft-tissue]
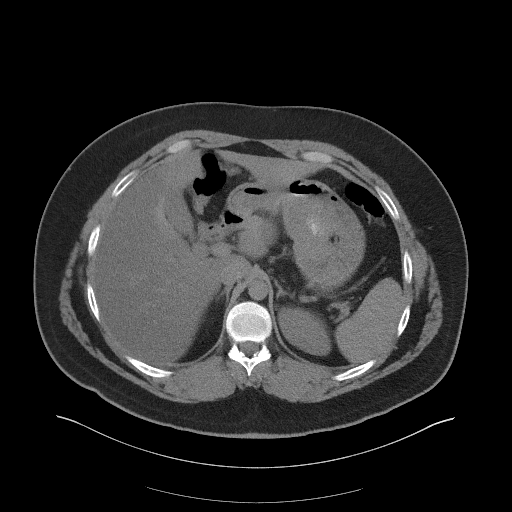
[im 91/105  soft-tissue]
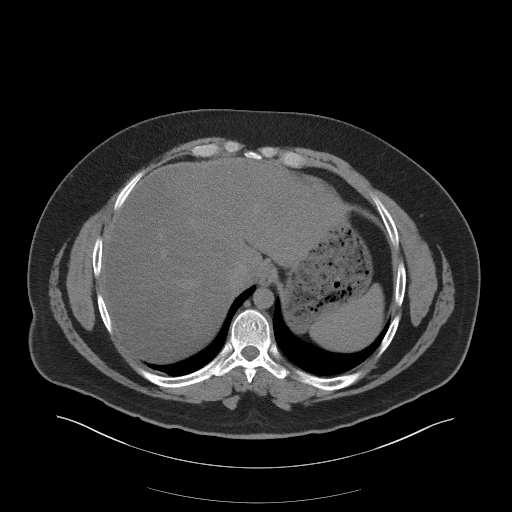
[im 100/105  soft-tissue]
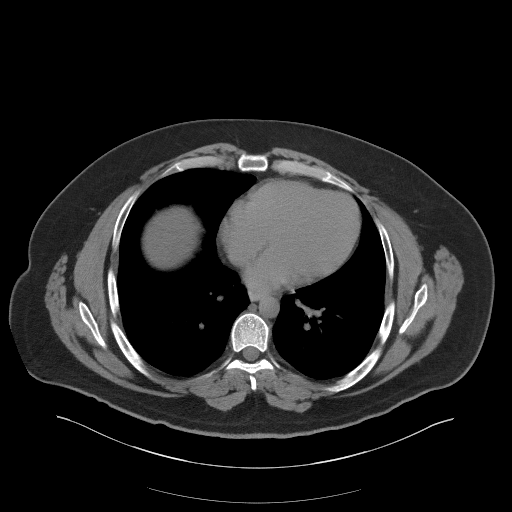

[Series 5: coronal st · coronal · 0.88mm/px · 3 of 101 slices shown]
[im 34/101  soft-tissue]
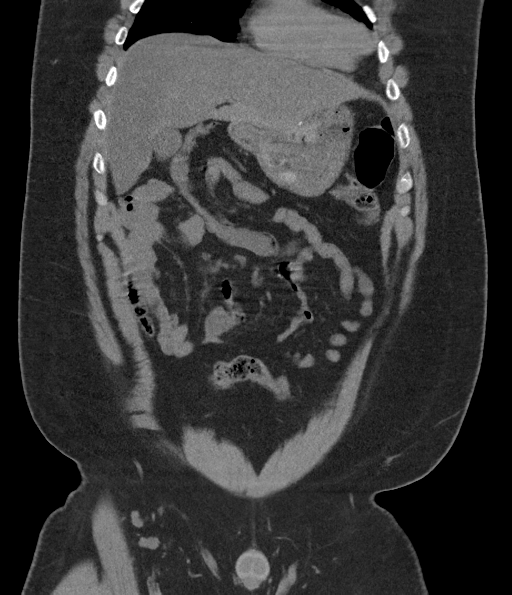
[im 45/101  soft-tissue]
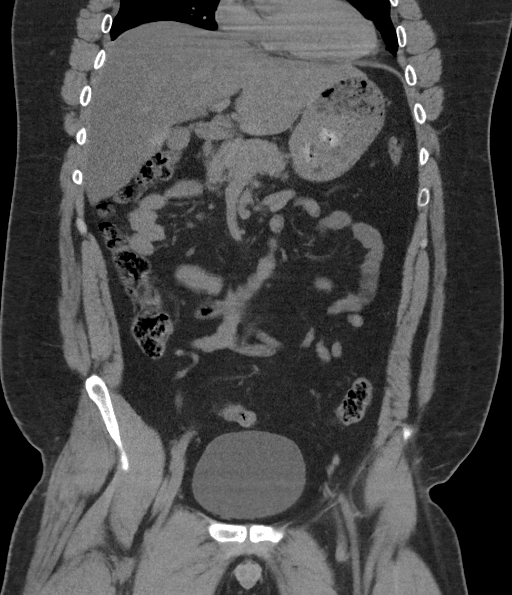
[im 56/101  soft-tissue]
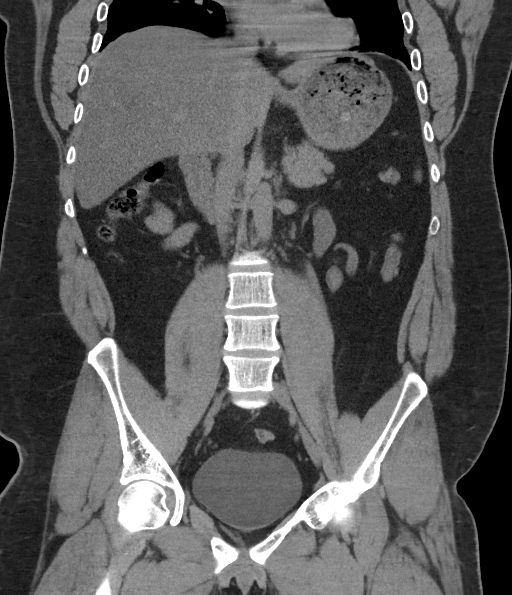

[16 of 46 positions shown; findings below may reference images not displayed]

FINDINGS: Lower chest: Lung bases clear

Hepatobiliary: Fatty infiltration of liver with focal sparing
adjacent to gallbladder fossa. Liver and gallbladder otherwise
unremarkable.

Pancreas: Normal appearance

Spleen: Normal appearance

Adrenals/Urinary Tract: Adrenal glands normal appearance.
Low-attenuation focus laterally in the LEFT kidney question cyst 15
mm diameter image 33. Mild LEFT hydronephrosis and proximal
hydroureter with a tiny proximal LEFT ureteral calculus image 53 and
an additional 3 mm diameter proximal LEFT ureteral calculus slightly
more distally. No additional urinary tract calcifications. RIGHT
ureter and distal LEFT ureter decompressed. Bladder unremarkable.

Stomach/Bowel: Normal appendix. Stomach and bowel loops
unremarkable.

Vascular/Lymphatic: Normal appearance

Reproductive: Prostate gland normal size with small central
calcification. Seminal vesicles unremarkable.

Other: No free air free fluid.  No hernia.  No inflammatory process.

Musculoskeletal: Osseous structures unremarkable.
IMPRESSION: Two small proximal LEFT ureteral calculi largest 3 mm diameter with
mild LEFT hydronephrosis and proximal hydroureter.

Probable small LEFT renal cyst.

Fatty infiltration of liver.

## 2023-10-22 ENCOUNTER — Ambulatory Visit
Admission: RE | Admit: 2023-10-22 | Discharge: 2023-10-22 | Disposition: A | Discharge status: Home / Self Care | Payer: Self-pay | Source: Ambulatory Visit

## 2023-10-22 ENCOUNTER — Ambulatory Visit: Payer: Managed Care, Other (non HMO)

## 2023-10-22 VITALS — BP 143/85 | HR 100 | Temp 97.9°F | Resp 20

## 2023-10-22 DIAGNOSIS — J22 Unspecified acute lower respiratory infection: Secondary | ICD-10-CM | POA: Diagnosis not present

## 2023-10-22 DIAGNOSIS — R059 Cough, unspecified: Secondary | ICD-10-CM | POA: Diagnosis not present

## 2023-10-22 MED ORDER — AMOXICILLIN-POT CLAVULANATE 875-125 MG PO TABS: 1.0000 | ORAL_TABLET | Freq: Two times a day (BID) | ORAL | 0 refills | Status: AC

## 2023-10-22 MED ORDER — PSEUDOEPH-BROMPHEN-DM 30-2-10 MG/5ML PO SYRP: 5.0000 mL | ORAL_SOLUTION | Freq: Four times a day (QID) | ORAL | 0 refills | Status: AC | PRN

## 2023-10-22 NOTE — ED Triage Notes (Signed)
Pt c/o cough x4 weeks, the patient reports having fatigue, body aches, and some wheezing.  Home interventions: mucinex, robitussin and cough drops.

## 2023-10-22 NOTE — ED Provider Notes (Signed)
[facility]    CSN: [id number] Arrival date & time: 10/22/23  0901      History   Chief Complaint Chief Complaint  Patient presents with   Cough    cough going on 4 weeks. Mucus is coming up with it. Sometimes is a dry cough. On 2nd pack of mucinex, bottle 4 of robutussin, 3rd pack of cough drops. Still not improving. No fever that I'm aware of. Fatigue, body aches, and some wheezing present. - Entered by patient   Fatigue   Generalized Body Aches    HPI [name] is a 32 y.o. male.   The history is provided by the patient.   Patient presents for complaints of cough, body aches, that has been present for the past 4 weeks.  Patient states that he also has been experiencing headache with the coughing.  He also endorses mild wheezing.  Reports cough is sometimes productive and sometimes nonproductive.  Patient denies fever, chills, chest pain, abdominal pain, nausea, vomiting, diarrhea, or rash.  Patient has been taking over-the-counter cough and cold medications with minimal relief of his symptoms.  Patient denies history of seasonal allergies, asthma, or smoking.  Past Medical History:  Diagnosis Date   Kidney stones     Patient Active Problem List   Diagnosis Date Noted   OBESITY 09/12/2008   INGUINAL HERNIA 09/12/2008   PYLORIC STENOSIS 09/12/2008    Past Surgical History:  Procedure Laterality Date   ABDOMINAL SURGERY     ADENOIDECTOMY     pyloric stenosis     TONSILLECTOMY         Home Medications    Prior to Admission medications   Medication Sig Start Date End Date Taking? Authorizing Provider  amoxicillin-clavulanate (AUGMENTIN) 875-125 MG tablet Take 1 tablet by mouth every 12 (twelve) hours. 10/22/23  Yes Leath-Warren, Sadie Haber, NP  BERBERINE CHLORIDE PO Take by mouth.   Yes [provider]  brompheniramine-pseudoephedrine-DM 30-2-10 MG/5ML syrup Take 5 mLs by mouth 4 (four) times daily as needed. 10/22/23  Yes  Leath-Warren, Sadie Haber, NP  CRANBERRY EXTRACT PO Take by mouth.   Yes [provider]  cephALEXin (KEFLEX) 500 MG capsule Take 1 capsule (500 mg total) by mouth 4 (four) times daily. For 7 days 08/23/17   Triplett, Tammy, PA-C  ondansetron (ZOFRAN) 4 MG tablet Take 1 tablet (4 mg total) by mouth every 6 (six) hours. 08/23/17   Triplett, Tammy, PA-C  oxyCODONE-acetaminophen (PERCOCET/ROXICET) 5-325 MG tablet Take 1 tablet by mouth every 4 (four) hours as needed. 08/23/17   Triplett, Tammy, PA-C  oxyCODONE-acetaminophen (PERCOCET/ROXICET) 5-325 MG tablet Take 1 tablet by mouth every 4 (four) hours as needed. 08/23/17   Pauline Aus, PA-C    Family History Family History  Problem Relation Age of Onset   Diabetes Mother    Birth defects Maternal Grandmother    Diabetes Maternal Grandmother    Hyperlipidemia Maternal Grandmother    Heart disease Maternal Grandmother    Stroke Maternal Grandmother    Diabetes Maternal Grandfather     Social History Social History   Tobacco Use   Smoking status: Never   Smokeless tobacco: Never  Substance Use Topics   Alcohol use: No   Drug use: No     Allergies   Codeine   Review of Systems Review of Systems Per HPI  Physical Exam Triage Vital Signs ED Triage Vitals  Encounter Vitals Group     BP 10/22/23 0924 (!) 154/107  Systolic BP Percentile --      Diastolic BP Percentile --      Pulse Rate 10/22/23 0924 100     Resp 10/22/23 0924 20     Temp 10/22/23 0924 97.9 F (36.6 C)     Temp Source 10/22/23 0924 Oral     SpO2 10/22/23 0924 95 %     Weight --      Height --      Head Circumference --      Peak Flow --      Pain Score 10/22/23 0923 1     Pain Loc --      Pain Education --      Exclude from Growth Chart --    No data found.  Updated Vital Signs BP (!) 143/85 (BP Location: Right Arm)   Pulse 100   Temp 97.9 F (36.6 C) (Oral)   Resp 20   SpO2 95%   Visual Acuity Right Eye Distance:   Left Eye  Distance:   Bilateral Distance:    Right Eye Near:   Left Eye Near:    Bilateral Near:     Physical Exam Vitals and nursing note reviewed.  Constitutional:      General: He is not in acute distress.    Appearance: Normal appearance.  HENT:     Head: Normocephalic.     Right Ear: Tympanic membrane, ear canal and external ear normal.     Left Ear: Tympanic membrane, ear canal and external ear normal.     Nose: Nose normal.     Mouth/Throat:     Mouth: Mucous membranes are moist.     Pharynx: No posterior oropharyngeal erythema.     Comments: Cobblestoning present to posterior oropharynx  Eyes:     Extraocular Movements: Extraocular movements intact.     Conjunctiva/sclera: Conjunctivae normal.     Pupils: Pupils are equal, round, and reactive to light.  Cardiovascular:     Rate and Rhythm: Normal rate and regular rhythm.     Pulses: Normal pulses.     Heart sounds: Normal heart sounds.  Pulmonary:     Effort: Pulmonary effort is normal. No respiratory distress.     Breath sounds: Normal breath sounds. No stridor. No wheezing, rhonchi or rales.  Abdominal:     General: Bowel sounds are normal.     Palpations: Abdomen is soft.     Tenderness: There is no abdominal tenderness.  Musculoskeletal:     Cervical back: Normal range of motion.  Lymphadenopathy:     Cervical: No cervical adenopathy.  Skin:    General: Skin is warm and dry.  Neurological:     General: No focal deficit present.     Mental Status: He is alert and oriented to person, place, and time.  Psychiatric:        Mood and Affect: Mood normal.        Behavior: Behavior normal.      UC Treatments / Results  Labs (all labs ordered are listed, but only abnormal results are displayed) Labs Reviewed - No data to display  EKG   Radiology DG Chest 2 View  Result Date: 10/22/2023 CLINICAL DATA:  4 week history of cough. EXAM: CHEST - 2 VIEW COMPARISON:  09/02/2005 FINDINGS: Right lung clear. Curvilinear  density left mid lung compatible with atelectasis or scarring. Of streaky retrocardiac density is more prominent than before suggesting component of posterior left base atelectasis or scarring. Pneumonia is not entirely  excluded. The cardiopericardial silhouette is within normal limits for size. No acute bony abnormality. IMPRESSION: 1. Curvilinear density left mid lung compatible with atelectasis or scarring. 2. Streaky retrocardiac density is more prominent than before suggesting component of posterior left base atelectasis or scarring. Pneumonia is not entirely excluded. Electronically Signed   By: Kennith Center M.D.   On: 10/22/2023 09:41    Procedures Procedures (including critical care time)  Medications Ordered in UC Medications - No data to display  Initial Impression / Assessment and Plan / UC Course  I have reviewed the triage vital signs and the nursing notes.  Pertinent labs & imaging results that were available during my care of the patient were reviewed by me and considered in my medical decision making (see chart for details).  On exam, lung sounds are clear throughout, room air sats at 95%.  Symptoms have been present for the past 4 weeks and have remained persistent.  Chest x-Jason cannot exclude pneumonia.  Given the duration of the patient's cough symptoms without relief despite use of over-the-counter medications, will treat empirically for possible bacterial etiology.  Augmentin 875/125 mg tablets prescribed to cover for possible pneumonia, along with Bromfed-DM for the cough.  Supportive care recommendations were provided and discussed with the patient to include fluids, rest, over-the-counter analgesics, and use of a humidifier during sleep.  Discussed indications with the patient of when follow-up will be indicated.  Patient was in agreement with this plan of care and verbalized understanding.  All questions were answered.  Patient stable for discharge.  Final Clinical  Impressions(s) / UC Diagnoses   Final diagnoses:  Lower respiratory infection  Cough, unspecified type     Discharge Instructions      Your chest x-Jason cannot rule out pneumonia.  I am treating you with Augmentin which will cover for any possible pneumonia. Take medication as prescribed. Increase fluids and allow for plenty of rest. May take over-the-counter Tylenol or ibuprofen as needed for pain, fever, or general discomfort. Recommend using a humidifier in your bedroom at nighttime during sleep and sleeping elevated on pillows while cough symptoms persist. If you develop new symptoms of fever, chills, worsening wheezing, shortness of breath, or difficulty breathing, please go to the emergency department immediately for further evaluation. I would like for you to follow-up with your primary care physician within the next 3 to 4 weeks for reevaluation and for possible repeat chest x-Jason. Follow-up as needed.     ED Prescriptions     Medication Sig Dispense Auth. Provider   amoxicillin-clavulanate (AUGMENTIN) 875-125 MG tablet Take 1 tablet by mouth every 12 (twelve) hours. 14 tablet Leath-Warren, Sadie Haber, NP   brompheniramine-pseudoephedrine-DM 30-2-10 MG/5ML syrup Take 5 mLs by mouth 4 (four) times daily as needed. 140 mL Leath-Warren, Sadie Haber, NP      PDMP not reviewed this encounter.   Abran Cantor, NP 10/22/23 1000

## 2023-10-22 NOTE — Discharge Instructions (Signed)
Your chest x-ray cannot rule out pneumonia.  I am treating you with Augmentin which will cover for any possible pneumonia. Take medication as prescribed. Increase fluids and allow for plenty of rest. May take over-the-counter Tylenol or ibuprofen as needed for pain, fever, or general discomfort. Recommend using a humidifier in your bedroom at nighttime during sleep and sleeping elevated on pillows while cough symptoms persist. If you develop new symptoms of fever, chills, worsening wheezing, shortness of breath, or difficulty breathing, please go to the emergency department immediately for further evaluation. I would like for you to follow-up with your primary care physician within the next 3 to 4 weeks for reevaluation and for possible repeat chest x-ray. Follow-up as needed.
